# Patient Record
Sex: Female | Born: 1995 | Race: White | Hispanic: No | Marital: Single | Smoking: Current every day smoker
Health system: Southern US, Community
[De-identification: ages and names within clinical notes are randomized; demographics above are authoritative.]

---
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
## 2014-02-02 IMAGING — CR DG CHEST 2V
2 series · 2 of 2 positions shown · non-contrast
Comparison: None.

CLINICAL DATA: Cough and congestion.  Fever.

CHEST - 2 VIEW

[view not recorded (1 of 2)]
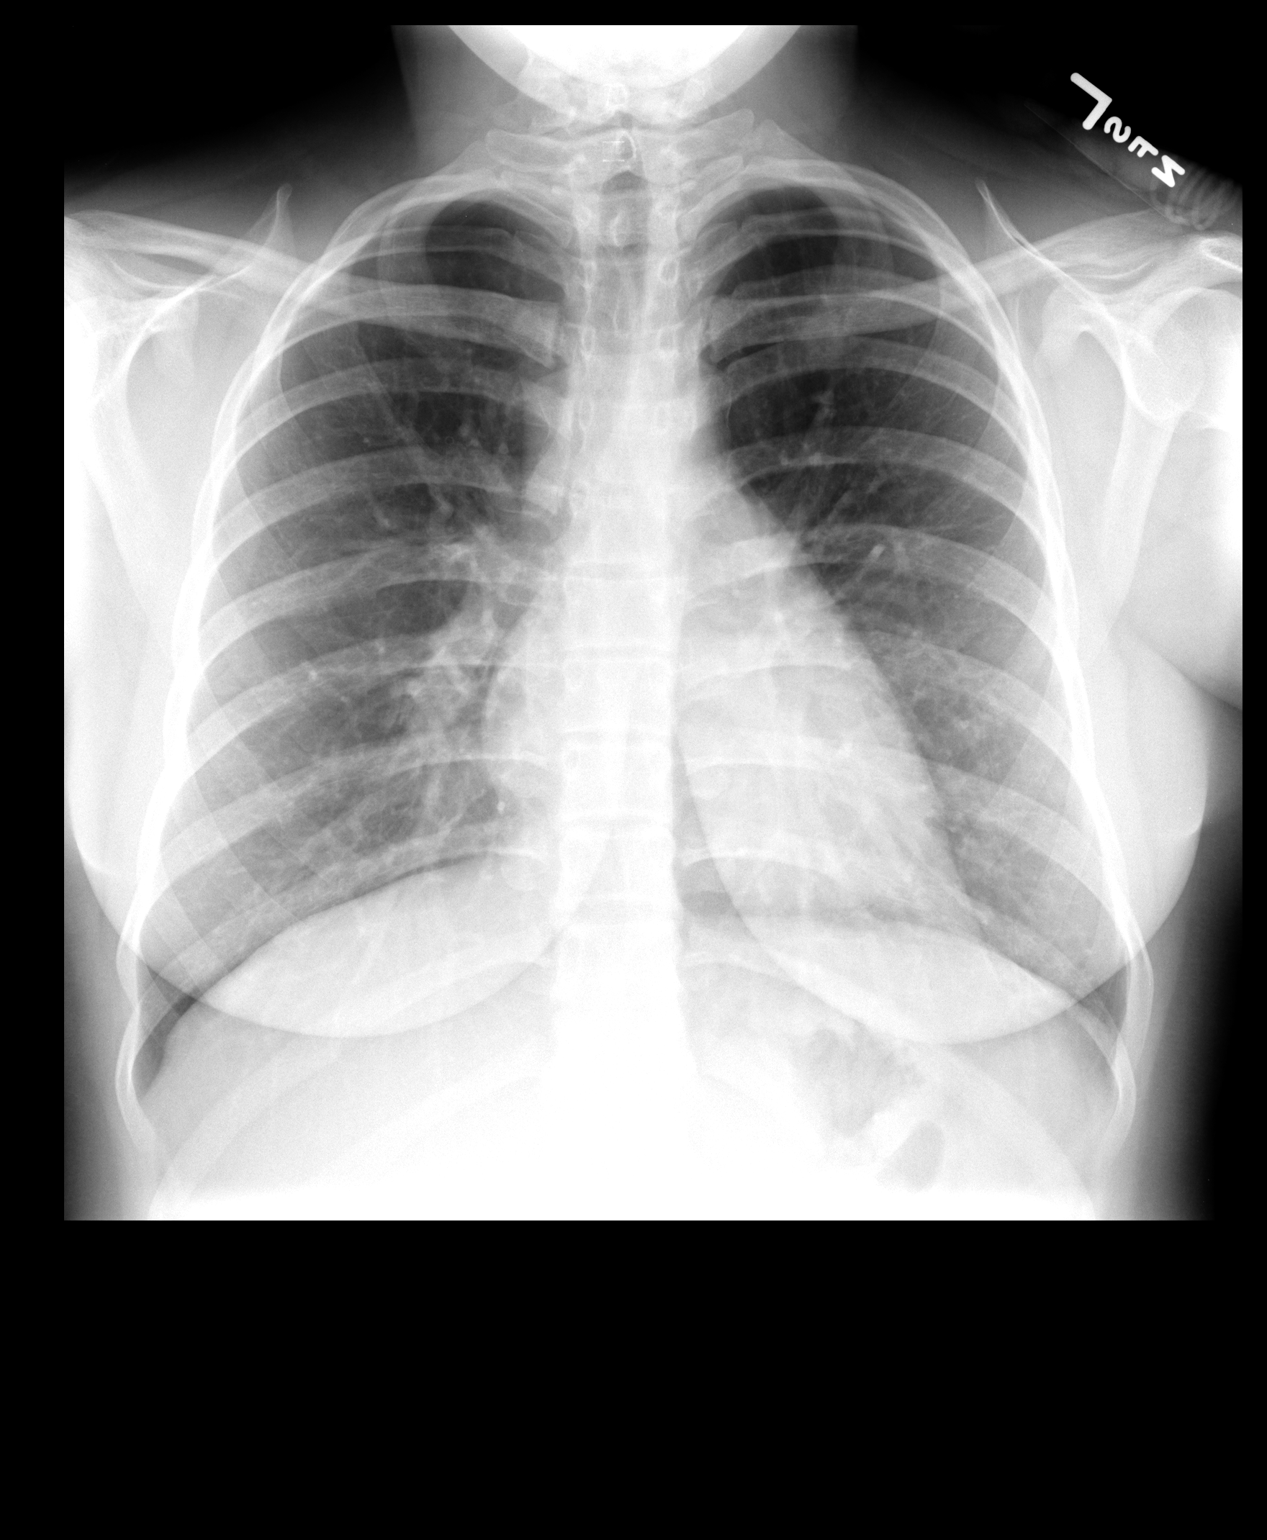

[view not recorded (2 of 2)]
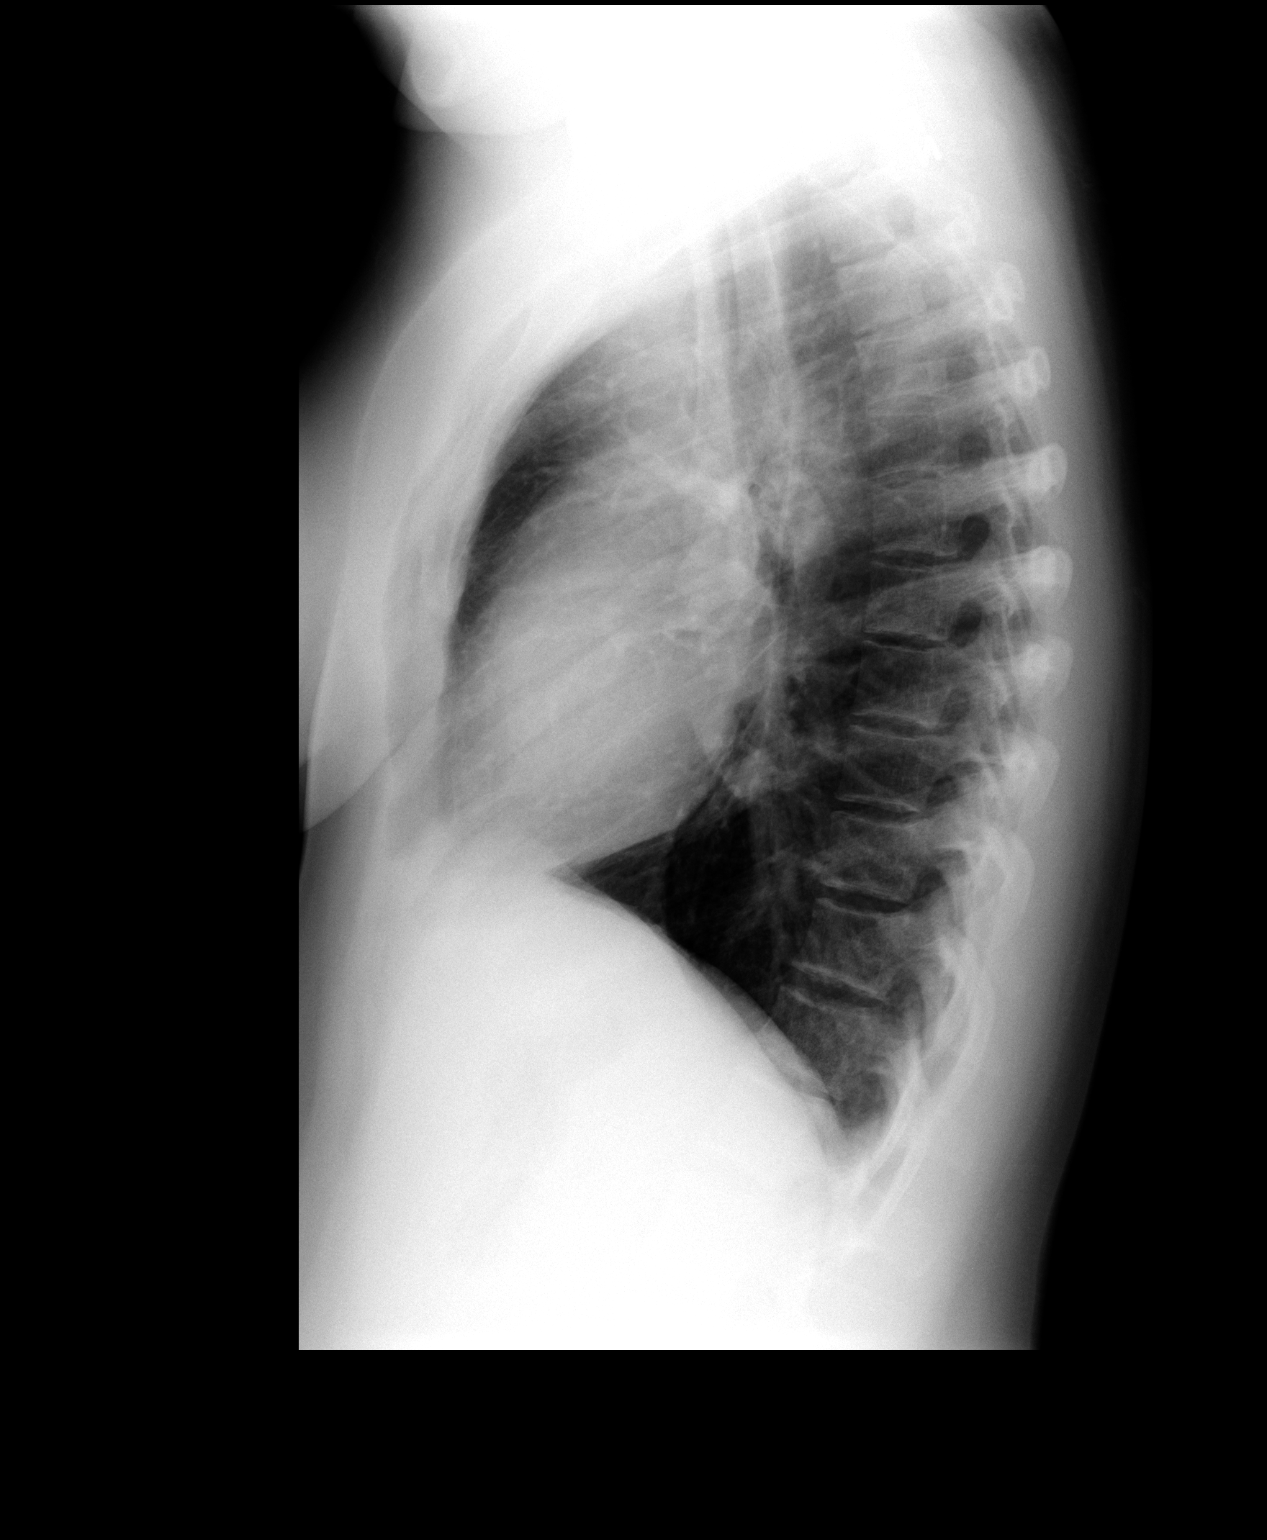

[2 of 2 positions shown; findings below may reference images not displayed]

FINDINGS: The lungs are clear without focal consolidation, edema,
effusion or pneumothorax.  Cardiopericardial silhouette is within
normal limits for size.  Imaged bony structures of the thorax are
intact.
IMPRESSION: Normal exam.
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
## 2019-12-17 ENCOUNTER — Other Ambulatory Visit: Payer: Self-pay

## 2019-12-17 ENCOUNTER — Ambulatory Visit (HOSPITAL_COMMUNITY)
Admission: EM | Admit: 2019-12-17 | Discharge: 2019-12-17 | Disposition: A | Discharge status: Home / Self Care | Attending: Family Medicine | Admitting: Family Medicine

## 2019-12-17 ENCOUNTER — Encounter (HOSPITAL_COMMUNITY): Payer: Self-pay | Admitting: Emergency Medicine

## 2019-12-17 ENCOUNTER — Ambulatory Visit (INDEPENDENT_AMBULATORY_CARE_PROVIDER_SITE_OTHER)

## 2019-12-17 DIAGNOSIS — Z3202 Encounter for pregnancy test, result negative: Secondary | ICD-10-CM | POA: Diagnosis not present

## 2019-12-17 DIAGNOSIS — M79671 Pain in right foot: Secondary | ICD-10-CM

## 2019-12-17 LAB — POC URINE PREG, ED: Preg Test, Ur: NEGATIVE

## 2019-12-17 NOTE — ED Provider Notes (Signed)
[facility]   [id number] 12/17/19 Arrival Time: 1154  ASSESSMENT & PLAN:  1. Foot pain, right     I have personally viewed the imaging studies ordered this visit. No fracture appreciated.  Recommend supportive shoe. WBAT.  Follow-up Information    [facility].   Specialty: Urgent Care Why: If worsening or failing to improve as anticipated. Contact information: [address] [place] [address] [phone]           Reviewed expectations re: course of current medical issues. Questions answered. Outlined signs and symptoms indicating need for more acute intervention. Patient verbalized understanding. After Visit Summary given.  SUBJECTIVE: History from: patient. Deborah Winters is a 24 y.o. female who reports fairly persistent moderate pain of her right dorsal foot; described as aching and throbbing; without radiation. Onset: abrupt. First noted: yesterday. Injury/trama: reports table fell onto foot; immediate pain. Symptoms have progressed to a point and plateaued since beginning. Has been able to bear weight but with pain. Aggravating factors: certain movements and weight bearing. Alleviating factors: have not been identified. Associated symptoms: none reported. Extremity sensation changes or weakness: none. Self treatment: none reported. History of similar: no.  History reviewed. No pertinent surgical history.    OBJECTIVE:  Vitals:   12/17/19 1238  BP: 123/72  Pulse: 78  Resp: 14  Temp: 98.2 F (36.8 C)  TempSrc: Oral  SpO2: 100%    General appearance: alert; no distress HEENT: [place]; AT Neck: supple with FROM Resp: unlabored respirations Extremities: . RLE: warm with well perfused appearance; poorly localized moderate tenderness over right dorsal foot; without gross deformities; swelling: minimal; bruising: minimal dorsally and laterally; ankle ROM: normal CV: brisk extremity capillary  refill of RLE; 2+ DP pulse of RLE. Skin: warm and dry; no visible rashes Neurologic: gait normal but favors RLE; normal sensation and strength of RLE Psychological: alert and cooperative; normal mood and affect  Imaging: DG Foot Complete Right  Result Date: 12/17/2019 CLINICAL DATA:  Dropped table on foot with pain, initial encounter EXAM: RIGHT FOOT COMPLETE - 3+ VIEW COMPARISON:  None. FINDINGS: Mild soft tissue swelling is noted distally. No acute fracture or dislocation is noted. IMPRESSION: Mild soft tissue swelling without acute bony abnormality. Electronically Signed   By: Alcide Clever M.D.   On: 12/17/2019 14:54     No Known Allergies  History reviewed. No pertinent past medical history.   Social History   Socioeconomic History  . Marital status: Single    Spouse name: Not on file  . Number of children: Not on file  . Years of education: Not on file  . Highest education level: Not on file  Occupational History  . Not on file  Tobacco Use  . Smoking status: Current Every Day Smoker    Types: Cigarettes  . Smokeless tobacco: Never Used  Substance and Sexual Activity  . Alcohol use: Yes  . Drug use: Not on file  . Sexual activity: Not on file  Other Topics Concern  . Not on file  Social History Narrative  . Not on file   Social Determinants of Health   Financial Resource Strain:   . Difficulty of Paying Living Expenses:   Food Insecurity:   . Worried About Programme researcher, broadcasting/film/video in the Last Year:   . Barista in the Last Year:   Transportation Needs:   . Freight forwarder (Medical):   Marland Kitchen Lack of Transportation (Non-Medical):  Physical Activity:   . Days of Exercise per Week:   . Minutes of Exercise per Session:   Stress:   . Feeling of Stress :   Social Connections:   . Frequency of Communication with Friends and Family:   . Frequency of Social Gatherings with Friends and Family:   . Attends Religious Services:   . Active Member of Clubs or  Organizations:   . Attends Archivist Meetings:   Marland Kitchen Marital Status:    No family history on file. History reviewed. No pertinent surgical history.    Vanessa Kick, MD 12/18/19 [phone]

## 2019-12-17 NOTE — ED Triage Notes (Signed)
Pt states a wooden table fell on her right foot last night. Pt states numbness and tingling is intermittent.

## 2020-01-11 ENCOUNTER — Other Ambulatory Visit: Payer: Self-pay

## 2020-01-11 ENCOUNTER — Encounter (HOSPITAL_COMMUNITY): Payer: Self-pay | Admitting: Emergency Medicine

## 2020-01-11 ENCOUNTER — Emergency Department (HOSPITAL_COMMUNITY)
Admission: EM | Admit: 2020-01-11 | Discharge: 2020-01-11 | Disposition: A | Discharge status: Home / Self Care | Payer: 59 | Attending: Emergency Medicine | Admitting: Emergency Medicine

## 2020-01-11 DIAGNOSIS — R55 Syncope and collapse: Secondary | ICD-10-CM | POA: Diagnosis not present

## 2020-01-11 DIAGNOSIS — Y999 Unspecified external cause status: Secondary | ICD-10-CM | POA: Insufficient documentation

## 2020-01-11 DIAGNOSIS — S0181XA Laceration without foreign body of other part of head, initial encounter: Secondary | ICD-10-CM | POA: Insufficient documentation

## 2020-01-11 DIAGNOSIS — X58XXXA Exposure to other specified factors, initial encounter: Secondary | ICD-10-CM | POA: Diagnosis not present

## 2020-01-11 DIAGNOSIS — F17210 Nicotine dependence, cigarettes, uncomplicated: Secondary | ICD-10-CM | POA: Insufficient documentation

## 2020-01-11 DIAGNOSIS — S0990XA Unspecified injury of head, initial encounter: Secondary | ICD-10-CM

## 2020-01-11 DIAGNOSIS — Y9389 Activity, other specified: Secondary | ICD-10-CM | POA: Insufficient documentation

## 2020-01-11 DIAGNOSIS — Y9289 Other specified places as the place of occurrence of the external cause: Secondary | ICD-10-CM | POA: Insufficient documentation

## 2020-01-11 DIAGNOSIS — S0993XA Unspecified injury of face, initial encounter: Secondary | ICD-10-CM | POA: Diagnosis present

## 2020-01-11 LAB — CBC
HCT: 39.2 % (ref 36.0–46.0)
Hemoglobin: 12.6 g/dL (ref 12.0–15.0)
MCH: 30.8 pg (ref 26.0–34.0)
MCHC: 32.1 g/dL (ref 30.0–36.0)
MCV: 95.8 fL (ref 80.0–100.0)
Platelets: 336 K/uL (ref 150–400)
RBC: 4.09 MIL/uL (ref 3.87–5.11)
RDW: 13.4 % (ref 11.5–15.5)
WBC: 7.1 K/uL (ref 4.0–10.5)
nRBC: 0 % (ref 0.0–0.2)

## 2020-01-11 LAB — I-STAT BETA HCG BLOOD, ED (MC, WL, AP ONLY): I-stat hCG, quantitative: <5 m[IU]/mL (ref <5)

## 2020-01-11 LAB — BASIC METABOLIC PANEL WITH GFR
Anion gap: 9 (ref 5–15)
BUN: 12 mg/dL (ref 6–20)
CO2: 25 mmol/L (ref 22–32)
Calcium: 9.5 mg/dL (ref 8.9–10.3)
Chloride: 106 mmol/L (ref 98–111)
Creatinine, Ser: 0.75 mg/dL (ref 0.44–1.00)
GFR calc Af Amer: >60 mL/min (ref >60)
GFR calc non Af Amer: >60 mL/min (ref >60)
Glucose, Bld: 99 mg/dL (ref 70–99)
Potassium: 4.1 mmol/L (ref 3.5–5.1)
Sodium: 140 mmol/L (ref 135–145)

## 2020-01-11 MED ORDER — SODIUM CHLORIDE 0.9% FLUSH: 3.0000 mL | Freq: Once | INTRAVENOUS | Status: DC
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
